# Patient Record
Sex: Female | Born: 1955 | Hispanic: No | Marital: Single | Smoking: Current every day smoker
Health system: Southern US, Community
[De-identification: ages and names within clinical notes are randomized; demographics above are authoritative.]

## PROBLEM LIST (undated history)

## (undated) DIAGNOSIS — T7840XA Allergy, unspecified, initial encounter: Secondary | ICD-10-CM

## (undated) DIAGNOSIS — K5732 Diverticulitis of large intestine without perforation or abscess without bleeding: Secondary | ICD-10-CM

## (undated) DIAGNOSIS — K219 Gastro-esophageal reflux disease without esophagitis: Secondary | ICD-10-CM

## (undated) DIAGNOSIS — G039 Meningitis, unspecified: Secondary | ICD-10-CM

## (undated) HISTORY — DX: Allergy, unspecified, initial encounter: T78.40XA

## (undated) HISTORY — DX: Diverticulitis of large intestine without perforation or abscess without bleeding: K57.32

## (undated) HISTORY — DX: Meningitis, unspecified: G03.9

## (undated) HISTORY — PX: APPENDECTOMY: SHX54

## (undated) HISTORY — PX: CHOLECYSTECTOMY: SHX55

## (undated) HISTORY — PX: TONSILLECTOMY: SUR1361

## (undated) HISTORY — DX: Gastro-esophageal reflux disease without esophagitis: K21.9

## (undated) HISTORY — PX: ABDOMINAL HYSTERECTOMY: SHX81

---

## 2008-02-24 ENCOUNTER — Ambulatory Visit: Payer: Self-pay | Admitting: Family Medicine

## 2008-02-24 DIAGNOSIS — G4700 Insomnia, unspecified: Secondary | ICD-10-CM | POA: Insufficient documentation

## 2008-02-24 DIAGNOSIS — G43009 Migraine without aura, not intractable, without status migrainosus: Secondary | ICD-10-CM | POA: Insufficient documentation

## 2008-02-24 DIAGNOSIS — J45909 Unspecified asthma, uncomplicated: Secondary | ICD-10-CM | POA: Insufficient documentation

## 2008-02-24 DIAGNOSIS — Z8719 Personal history of other diseases of the digestive system: Secondary | ICD-10-CM

## 2008-02-24 DIAGNOSIS — M545 Low back pain: Secondary | ICD-10-CM | POA: Insufficient documentation

## 2008-02-24 DIAGNOSIS — K219 Gastro-esophageal reflux disease without esophagitis: Secondary | ICD-10-CM | POA: Insufficient documentation

## 2008-02-24 DIAGNOSIS — L509 Urticaria, unspecified: Secondary | ICD-10-CM | POA: Insufficient documentation

## 2008-02-24 DIAGNOSIS — F411 Generalized anxiety disorder: Secondary | ICD-10-CM | POA: Insufficient documentation

## 2008-02-24 DIAGNOSIS — J309 Allergic rhinitis, unspecified: Secondary | ICD-10-CM | POA: Insufficient documentation

## 2008-02-25 ENCOUNTER — Ambulatory Visit (HOSPITAL_COMMUNITY): Admission: RE | Admit: 2008-02-25 | Discharge: 2008-02-25 | Payer: Self-pay | Admitting: Obstetrics & Gynecology

## 2008-03-09 ENCOUNTER — Telehealth: Payer: Self-pay | Admitting: Family Medicine

## 2008-03-09 ENCOUNTER — Ambulatory Visit: Payer: Self-pay | Admitting: Family Medicine

## 2008-03-14 LAB — CONVERTED CEMR LAB
AST: 16 units/L (ref 0–37)
Albumin: 3.9 g/dL (ref 3.5–5.2)
BUN: 12 mg/dL (ref 6–23)
Bilirubin, Direct: 0.1 mg/dL (ref 0.0–0.3)
Calcium: 9.1 mg/dL (ref 8.4–10.5)
Cholesterol: 189 mg/dL (ref 0–200)
Creatinine, Ser: 0.8 mg/dL (ref 0.4–1.2)
GFR calc non Af Amer: 80 mL/min
HDL: 39.6 mg/dL (ref >39.0)
LDL Cholesterol: 137 mg/dL — ABNORMAL HIGH (ref 0–99)
Potassium: 4.5 meq/L (ref 3.5–5.1)
Total Bilirubin: 0.8 mg/dL (ref 0.3–1.2)
Triglycerides: 63 mg/dL (ref 0–149)

## 2008-08-25 ENCOUNTER — Telehealth: Payer: Self-pay | Admitting: Family Medicine

## 2010-12-16 ENCOUNTER — Encounter: Payer: Self-pay | Admitting: Family Medicine

## 2010-12-17 ENCOUNTER — Ambulatory Visit (INDEPENDENT_AMBULATORY_CARE_PROVIDER_SITE_OTHER): Payer: Managed Care, Other (non HMO) | Admitting: Family Medicine

## 2010-12-17 ENCOUNTER — Encounter: Payer: Self-pay | Admitting: Family Medicine

## 2010-12-17 VITALS — BP 140/80 | HR 68 | Temp 98.5°F | Ht 69.0 in | Wt 201.1 lb

## 2010-12-17 DIAGNOSIS — M545 Low back pain, unspecified: Secondary | ICD-10-CM

## 2010-12-17 DIAGNOSIS — Z23 Encounter for immunization: Secondary | ICD-10-CM

## 2010-12-17 DIAGNOSIS — R635 Abnormal weight gain: Secondary | ICD-10-CM

## 2010-12-17 MED ORDER — TRAMADOL HCL 50 MG PO TABS: 50.0000 mg | ORAL_TABLET | Freq: Four times a day (QID) | ORAL | Status: AC | PRN

## 2010-12-17 MED ORDER — DICLOFENAC SODIUM 75 MG PO TBEC: 75.0000 mg | DELAYED_RELEASE_TABLET | Freq: Two times a day (BID) | ORAL | Status: AC

## 2010-12-17 MED ORDER — CYCLOBENZAPRINE HCL 10 MG PO TABS: 10.0000 mg | ORAL_TABLET | Freq: Three times a day (TID) | ORAL | Status: AC | PRN
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
## 2010-12-18 MED ORDER — LEVOTHYROXINE SODIUM 50 MCG PO TABS: 50.0000 ug | ORAL_TABLET | Freq: Every day | ORAL | Status: DC

## 2010-12-18 NOTE — Progress Notes (Signed)
Addended by: Consuello Masse on: 12/18/2010 02:57 PM   Modules accepted: Orders

## 2011-01-15 ENCOUNTER — Other Ambulatory Visit: Payer: Self-pay | Admitting: Family Medicine

## 2011-01-15 ENCOUNTER — Other Ambulatory Visit (INDEPENDENT_AMBULATORY_CARE_PROVIDER_SITE_OTHER): Payer: 59

## 2011-01-15 ENCOUNTER — Other Ambulatory Visit: Payer: Self-pay | Admitting: *Deleted

## 2011-01-15 DIAGNOSIS — E039 Hypothyroidism, unspecified: Secondary | ICD-10-CM

## 2011-01-15 NOTE — Telephone Encounter (Signed)
She is overdur for CPE... Please schedule with labs prior unless done elsewhere, otherwise she needs follow up appt. Okay to refill until appt.

## 2011-01-15 NOTE — Telephone Encounter (Signed)
Pt is losing her insurance and requests a 90 day supply.

## 2011-01-15 NOTE — Telephone Encounter (Signed)
Patient is losing insurance at the end of week and needs this before insurance runs out

## 2011-01-16 ENCOUNTER — Telehealth: Payer: Self-pay | Admitting: Family Medicine

## 2011-01-16 DIAGNOSIS — E039 Hypothyroidism, unspecified: Secondary | ICD-10-CM | POA: Insufficient documentation

## 2011-01-16 MED ORDER — LEVOTHYROXINE SODIUM 88 MCG PO TABS: 88.0000 ug | ORAL_TABLET | Freq: Every day | ORAL | Status: DC

## 2011-01-16 MED ORDER — BUPROPION HCL ER (SR) 150 MG PO TB12: 150.0000 mg | ORAL_TABLET | Freq: Two times a day (BID) | ORAL | Status: DC

## 2011-01-16 NOTE — Telephone Encounter (Signed)
Better but still not at goal.. Need to increase thyroid medication further. Recheck in 4-6 weeks.

## 2011-01-16 NOTE — Telephone Encounter (Signed)
Okay to refill once.. But she needs to make  appt for CPX  or follow up (if goes to GYN) since not had since 2010. If she cannot make CPX due to no insurance... [facility] needs to [facility] into [facility]. We cannot continue to prescribe without seeing her.

## 2011-01-17 MED ORDER — BUPROPION HCL ER (SR) 150 MG PO TB12: 150.0000 mg | ORAL_TABLET | Freq: Two times a day (BID) | ORAL | Status: AC

## 2011-01-17 MED ORDER — LEVOTHYROXINE SODIUM 88 MCG PO TABS: 88.0000 ug | ORAL_TABLET | Freq: Every day | ORAL | Status: AC

## 2011-01-17 NOTE — Telephone Encounter (Signed)
Spoke with patient and she will come in when her new insurance comes in effect at first of year

## 2011-01-20 NOTE — Telephone Encounter (Signed)
Spoke with patient and she will schedule cpx at first of year when insurance comes in effect

## 2014-01-17 ENCOUNTER — Ambulatory Visit: Payer: Self-pay | Admitting: Internal Medicine

## 2016-02-18 ENCOUNTER — Telehealth (INDEPENDENT_AMBULATORY_CARE_PROVIDER_SITE_OTHER): Payer: Self-pay | Admitting: Radiology

## 2016-02-18 NOTE — Telephone Encounter (Signed)
Patient called, LMVM triage, asking if she needs prophylactic abx prior to dental cleaning.  Per Dr Lajoyce Cornersuda no abx needed.  IC pt and advised.

## 2020-09-10 ENCOUNTER — Other Ambulatory Visit: Payer: Self-pay | Admitting: Internal Medicine

## 2020-09-10 DIAGNOSIS — Z1231 Encounter for screening mammogram for malignant neoplasm of breast: Secondary | ICD-10-CM

## 2020-09-13 ENCOUNTER — Ambulatory Visit
Admission: RE | Admit: 2020-09-13 | Discharge: 2020-09-13 | Disposition: A | Discharge status: Home / Self Care | Payer: Medicare HMO | Source: Ambulatory Visit | Attending: Internal Medicine | Admitting: Internal Medicine

## 2020-09-13 ENCOUNTER — Other Ambulatory Visit: Payer: Self-pay

## 2020-09-13 DIAGNOSIS — Z1231 Encounter for screening mammogram for malignant neoplasm of breast: Secondary | ICD-10-CM | POA: Diagnosis present

## 2023-03-08 IMAGING — MG MM DIGITAL SCREENING BILAT W/ TOMO AND CAD
6 of 10 series · 6 of 30 positions shown · non-contrast
Comparison: Previous exam(s).

CLINICAL DATA: Screening.

EXAM:
DIGITAL SCREENING BILATERAL MAMMOGRAM WITH TOMOSYNTHESIS AND CAD
TECHNIQUE: Bilateral screening digital craniocaudal and mediolateral oblique
mammograms were obtained. Bilateral screening digital breast
tomosynthesis was performed. The images were evaluated with
computer-aided detection.

[L MLO synth-2D (1 of 2)]
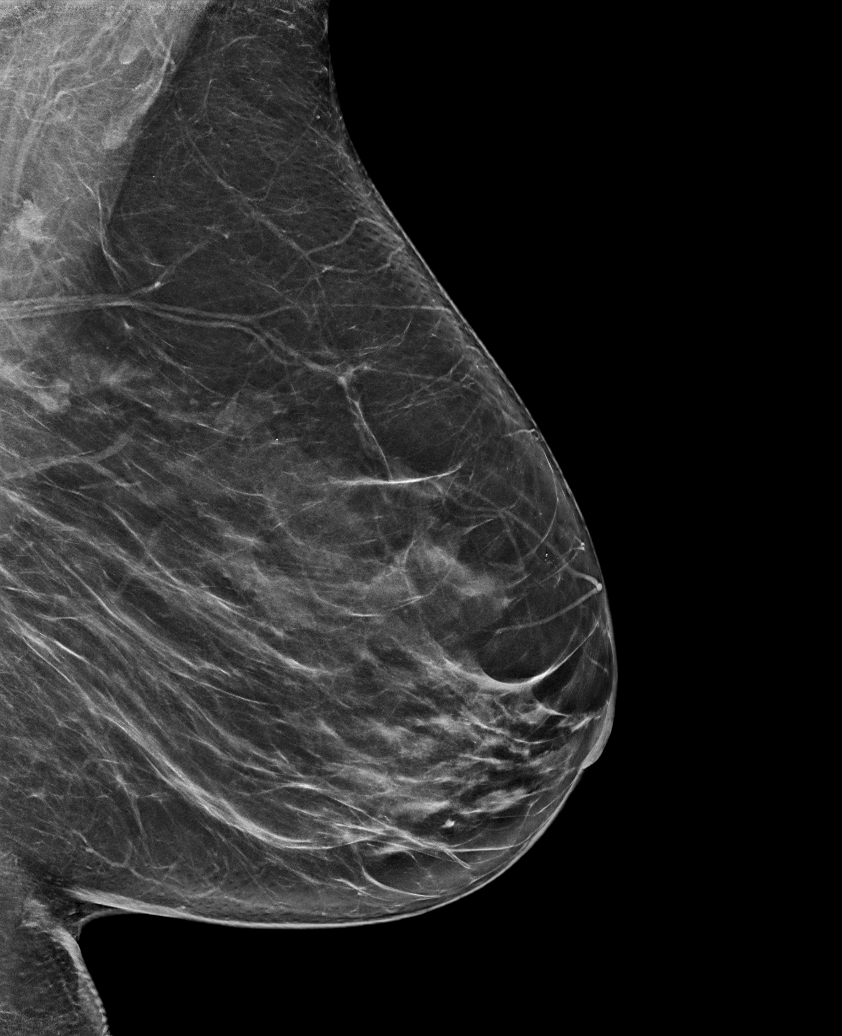

[L CC synth-2D]
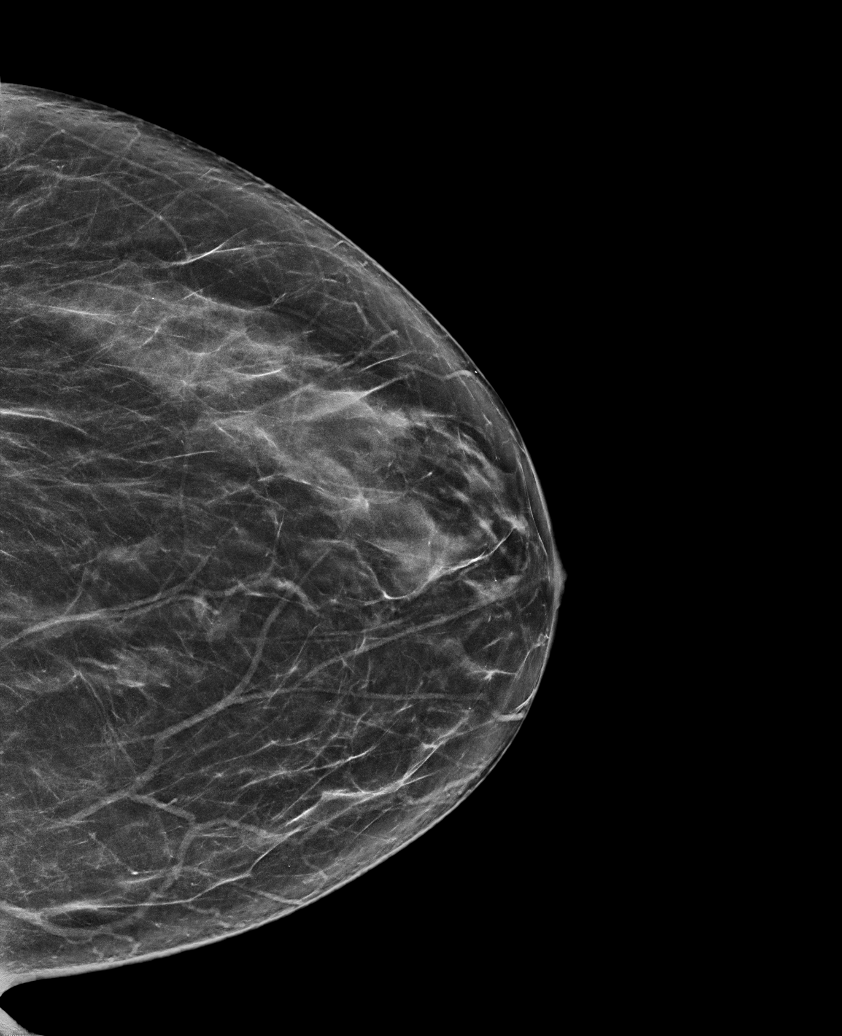

[R MLO synth-2D]
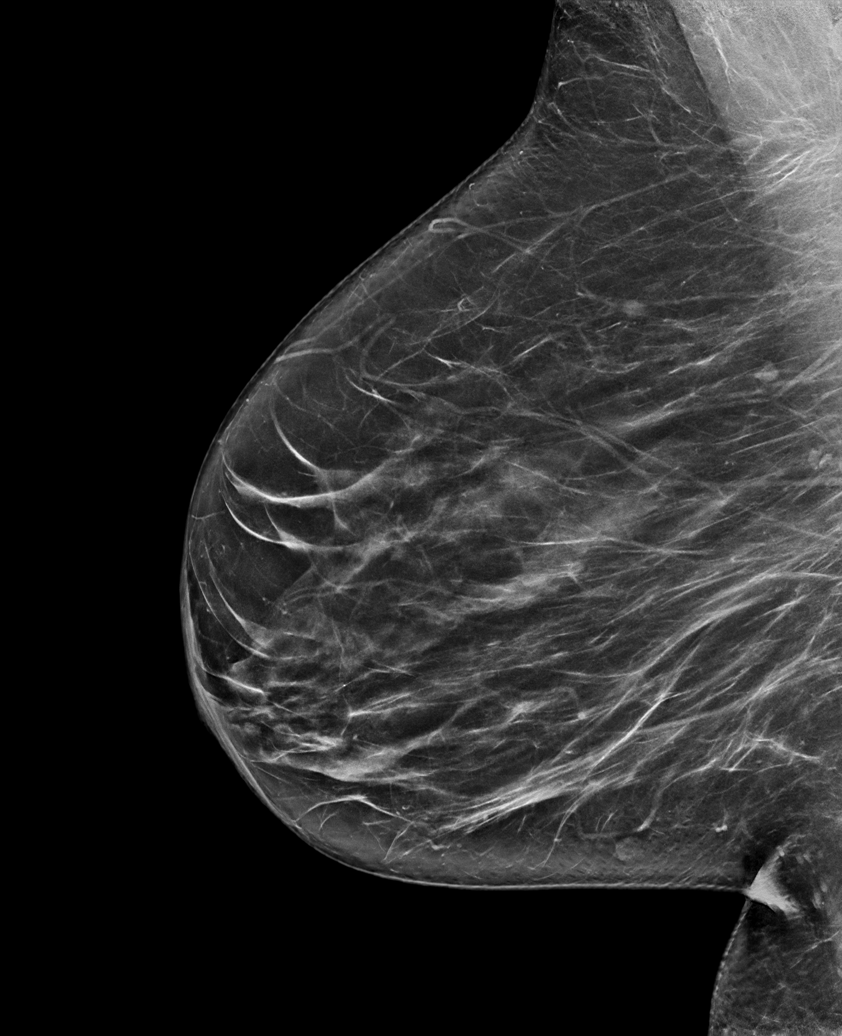

[L MLO synth-2D (2 of 2)]
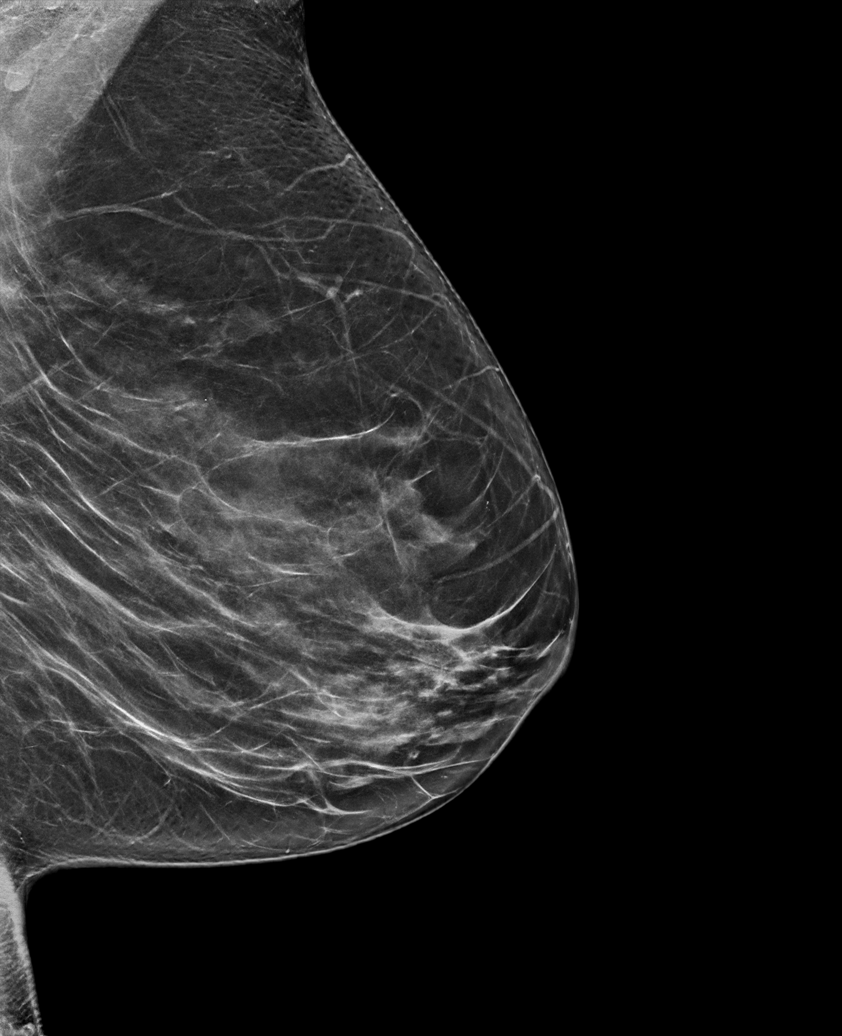

[R CC synth-2D]
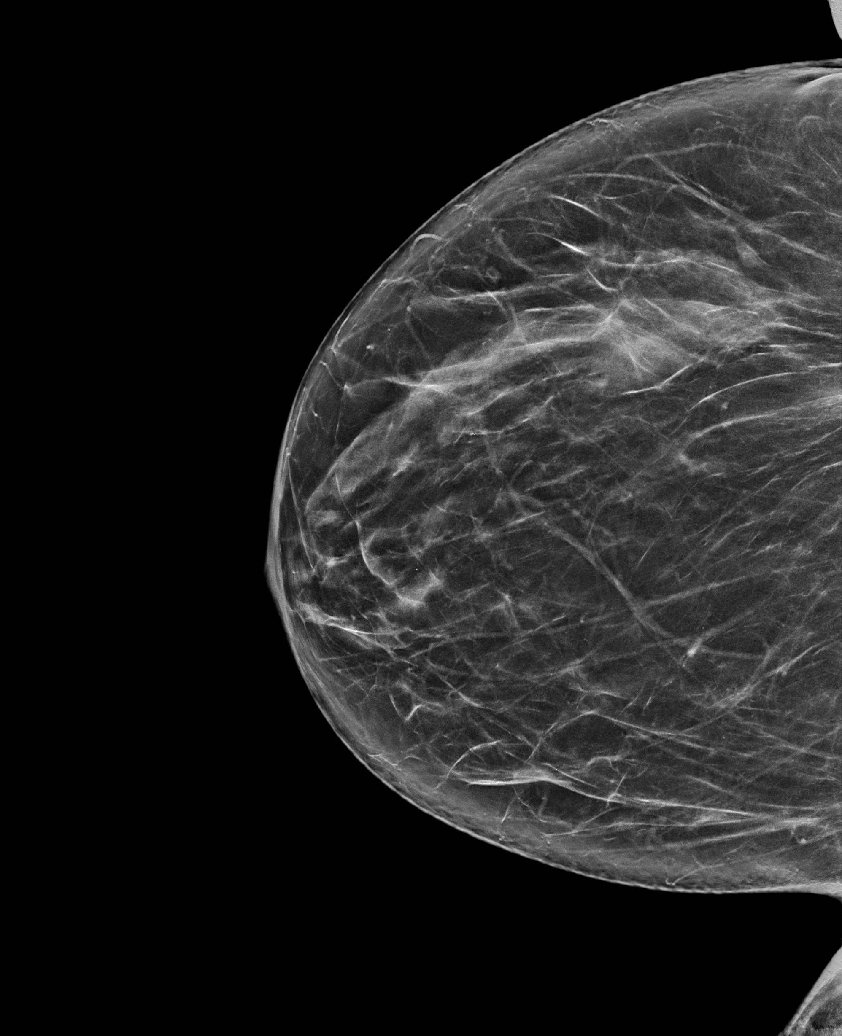

[L MLO tomo · tomo slice 41/80.0]
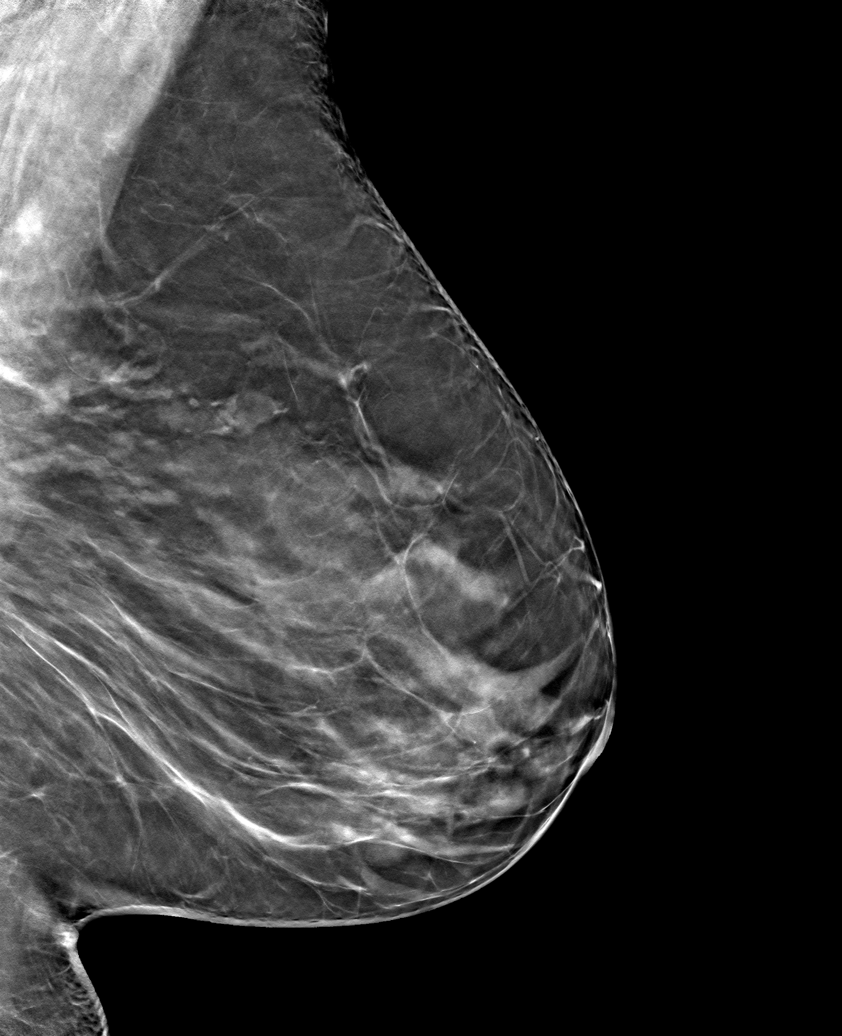

[6 of 30 positions shown; findings below may reference images not displayed]

ACR Breast Density Category c: The breast tissue is heterogeneously
dense, which may obscure small masses.
FINDINGS: There are no findings suspicious for malignancy. The images were
evaluated with computer-aided detection.
IMPRESSION: No mammographic evidence of malignancy. A result letter of this
screening mammogram will be mailed directly to the patient.

RECOMMENDATION:
Screening mammogram in one year. (Code:T4-5-GWO)

BI-RADS CATEGORY  1: Negative.
